# Patient Record
Sex: Female | Born: 1970 | Race: White | Hispanic: No | Marital: Married | Smoking: Former smoker
Health system: Southern US, Community
[De-identification: ages and names within clinical notes are randomized; demographics above are authoritative.]

## PROBLEM LIST (undated history)

## (undated) HISTORY — PX: NO PAST SURGERIES: SHX2092

---

## 2016-07-31 ENCOUNTER — Other Ambulatory Visit: Payer: Self-pay | Admitting: Obstetrics and Gynecology

## 2016-07-31 DIAGNOSIS — N6489 Other specified disorders of breast: Secondary | ICD-10-CM

## 2016-08-04 ENCOUNTER — Ambulatory Visit
Admission: RE | Admit: 2016-08-04 | Discharge: 2016-08-04 | Disposition: A | Discharge status: Home / Self Care | Payer: BC Managed Care – PPO | Source: Ambulatory Visit | Attending: Obstetrics and Gynecology | Admitting: Obstetrics and Gynecology

## 2016-08-04 DIAGNOSIS — N6489 Other specified disorders of breast: Secondary | ICD-10-CM

## 2019-06-23 DIAGNOSIS — D72819 Decreased white blood cell count, unspecified: Secondary | ICD-10-CM

## 2020-07-05 DIAGNOSIS — F419 Anxiety disorder, unspecified: Secondary | ICD-10-CM

## 2020-07-05 HISTORY — DX: Anxiety disorder, unspecified: F41.9

## 2020-07-26 ENCOUNTER — Encounter: Payer: Self-pay | Admitting: Cardiology

## 2020-07-26 DIAGNOSIS — K589 Irritable bowel syndrome without diarrhea: Secondary | ICD-10-CM

## 2020-07-26 DIAGNOSIS — R5383 Other fatigue: Secondary | ICD-10-CM

## 2020-07-26 DIAGNOSIS — K649 Unspecified hemorrhoids: Secondary | ICD-10-CM

## 2020-07-26 DIAGNOSIS — H9209 Otalgia, unspecified ear: Secondary | ICD-10-CM

## 2020-07-26 DIAGNOSIS — B029 Zoster without complications: Secondary | ICD-10-CM

## 2020-07-26 DIAGNOSIS — R209 Unspecified disturbances of skin sensation: Secondary | ICD-10-CM

## 2020-07-26 DIAGNOSIS — R5381 Other malaise: Secondary | ICD-10-CM | POA: Insufficient documentation

## 2020-07-26 DIAGNOSIS — J309 Allergic rhinitis, unspecified: Secondary | ICD-10-CM | POA: Insufficient documentation

## 2020-07-26 HISTORY — DX: Zoster without complications: B02.9

## 2020-07-26 HISTORY — DX: Otalgia, unspecified ear: H92.09

## 2020-07-26 HISTORY — DX: Other malaise: R53.81

## 2020-07-26 HISTORY — DX: Unspecified hemorrhoids: K64.9

## 2020-07-26 HISTORY — DX: Irritable bowel syndrome, unspecified: K58.9

## 2020-07-26 HISTORY — DX: Unspecified disturbances of skin sensation: R20.9

## 2020-07-26 HISTORY — DX: Other fatigue: R53.83

## 2020-07-26 HISTORY — DX: Allergic rhinitis, unspecified: J30.9

## 2020-07-27 ENCOUNTER — Encounter: Payer: Self-pay | Admitting: Cardiology

## 2020-07-27 ENCOUNTER — Other Ambulatory Visit: Payer: Self-pay

## 2020-07-27 ENCOUNTER — Ambulatory Visit: Payer: BC Managed Care – PPO | Admitting: Cardiology

## 2020-07-27 VITALS — BP 100/78 | HR 74 | Ht 65.5 in | Wt 157.0 lb

## 2020-07-27 DIAGNOSIS — I517 Cardiomegaly: Secondary | ICD-10-CM | POA: Diagnosis not present

## 2020-07-27 DIAGNOSIS — R002 Palpitations: Secondary | ICD-10-CM

## 2020-07-27 DIAGNOSIS — R072 Precordial pain: Secondary | ICD-10-CM

## 2020-07-27 MED ORDER — METOPROLOL TARTRATE 100 MG PO TABS: 100.0000 mg | ORAL_TABLET | ORAL | 0 refills | Status: AC

## 2020-07-27 MED ORDER — NITROGLYCERIN 0.4 MG SL SUBL
0.4000 mg | SUBLINGUAL_TABLET | SUBLINGUAL | 3 refills | Status: AC | PRN
Start: 2020-07-27 — End: ?

## 2020-07-27 NOTE — Patient Instructions (Addendum)
Medication Instructions:  Start Nitroglycerin 0.4 mg every 5 minutes as needed for chest pain   *If you need a refill on your cardiac medications before your next appointment, please call your pharmacy*   Lab Work: Your physician recommends that you return for lab work 1 week before your cardiac cta   If you have labs (blood work) drawn today and your tests are completely normal, you will receive your results only by: Marland Kitchen MyChart Message (if you have MyChart) OR . A paper copy in the mail If you have any lab test that is abnormal or we need to change your treatment, we will call you to review the results.   Testing/Procedures: Your physician has requested that you have an echocardiogram. Echocardiography is a painless test that uses sound waves to create images of your heart. It provides your doctor with information about the size and shape of your heart and how well your heart's chambers and valves are working. This procedure takes approximately one hour. There are no restrictions for this procedure. Your physician has requested that you have a Cardiac CTA *Instructions Below*   Follow-Up: At [facility], you and your health needs are our priority.  As part of our continuing mission to provide you with exceptional heart care, we have created designated Provider Care Teams.  These Care Teams include your primary Cardiologist (physician) and Advanced Practice Providers (APPs -  Physician Assistants and Nurse Practitioners) who all work together to provide you with the care you need, when you need it.  We recommend signing up for the patient portal called "MyChart".  Sign up information is provided on this After Visit Summary.  MyChart is used to connect with patients for Virtual Visits (Telemedicine).  Patients are able to view lab/test results, encounter notes, upcoming appointments, etc.  Non-urgent messages can be sent to your provider as well.   To learn more about what you can do with  MyChart, go to [email or web address].    Your next appointment:   3 month(s)  The format for your next appointment:   In Person  Provider:   Berniece Salines, DO   Other Instructions Your cardiac CT will be scheduled at one of the below locations:   [facility] [address] [place], [place] [address] [phone]   If scheduled at [facility], please arrive at the [facility] main entrance of [facility] 30 minutes prior to test start time. Proceed to the [facility] Radiology Department (first floor) to check-in and test prep.  Please follow these instructions carefully (unless otherwise directed):  On the Night Before the Test: . Be sure to Drink plenty of water. . Do not consume any caffeinated/decaffeinated beverages or chocolate 12 hours prior to your test. . Do not take any antihistamines 12 hours prior to your test.  On the Day of the Test: . Drink plenty of water. Do not drink any water within one hour of the test. . Do not eat any food 4 hours prior to the test. . You may take your regular medications prior to the test.  . Take metoprolol (Lopressor) two hours prior to test. . FEMALES- please wear underwire-free bra if available      After the Test: . Drink plenty of water. . After receiving IV contrast, you may experience a mild flushed feeling. This is normal. . On occasion, you may experience a mild rash up to 24 hours after the test. This is not dangerous. If  this occurs, you can take Benadryl 25 mg and increase your fluid intake. . If you experience trouble breathing, this can be serious. If it is severe call 911 IMMEDIATELY. If it is mild, please call our office.   Once we have confirmed authorization from your insurance company, we will call you to set up a date and time for your test. Based on how quickly your insurance processes prior authorizations requests, please allow up to 4 weeks to be contacted for scheduling your  Cardiac CT appointment. Be advised that routine Cardiac CT appointments could be scheduled as many as 8 weeks after your provider has ordered it.  For non-scheduling related questions, please contact the cardiac imaging nurse navigator should you have any questions/concerns: Marchia Bond, Cardiac Imaging Nurse Navigator Burley Saver, Interim Cardiac Imaging Nurse [place] [facility] Direct Office Dial: [phone]   For scheduling needs, including cancellations and rescheduling, please call Vivien Rota at [phone], option 3.

## 2020-07-27 NOTE — Progress Notes (Signed)
Cardiology Office Note:    Date:  07/27/2020   ID:  Kristen Burch, DOB 11/11/1971, MRN [id number]  PCP:  Lowella Dandy, NP  Cardiologist:  Berniece Salines, DO  Electrophysiologist:  None   Referring MD: Lowella Dandy, NP   Chief Complaint  Patient presents with  . Chest Pain    History of Present Illness:    Kristen Burch is a 49 y.o. female with a hx of anxiety, chronic allergic rhinitis and IBS presents today to be evaluated for chest pain.  She describes some intermittent chest pain which is left-sided nature.  She tells me that mostly it is a chest heaviness that starts in the left side radiates of her shoulders and sometimes upper jaw.  She tells me sometimes it goes up her shoulder but not ever down her arms.  The pain usually last for a few minutes prior to resolution.  Nothing make it better or worse.  She is concerned about this.  In addition she tells me there are some intermittent palpitations very seldom but it recurs.  She denies any shortness of breath.  She states the chest pain is her main concern and she is worried about this.  Of note she tells me in the past that she had had echocardiogram 60 years ago as well as wore a monitor for palpitations.  She says she was told that she had some skipped beats.  Past Medical History:  Diagnosis Date  . Anxiety 07/05/2020  . Chronic allergic rhinitis 07/26/2020  . Disturbance of skin sensation 07/26/2020  . Ear pain 07/26/2020  . Hemorrhoids without complication 06/29/7208  . Herpes zoster without complication 03/31/961  . IBS (irritable bowel syndrome) 07/26/2020  . Malaise and fatigue 07/26/2020    Past Surgical History:  Procedure Laterality Date  . NO PAST SURGERIES      Current Medications: Current Meds  Medication Sig  . ALPRAZolam (XANAX) 0.25 MG tablet Take 0.25 mg by mouth every 8 (eight) hours as needed.  . fluticasone (FLONASE) 50 MCG/ACT nasal spray Place 1 spray into the nose daily.  . ST JOHNS WORT EXTRACT PO Take by mouth.      Allergies:   Patient has no known allergies.   Social History   Socioeconomic History  . Marital status: Married    Spouse name: Not on file  . Number of children: Not on file  . Years of education: Not on file  . Highest education level: Not on file  Occupational History  . Not on file  Tobacco Use  . Smoking status: Former Smoker    Quit date: 07/26/2000    Years since quitting: 20.0  . Smokeless tobacco: Never Used  Substance and Sexual Activity  . Alcohol use: Never  . Drug use: Never  . Sexual activity: Not on file  Other Topics Concern  . Not on file  Social History Narrative  . Not on file   Social Determinants of Health   Financial Resource Strain:   . Difficulty of Paying Living Expenses:   Food Insecurity:   . Worried About Charity fundraiser in the Last Year:   . Arboriculturist in the Last Year:   Transportation Needs:   . Film/video editor (Medical):   Marland Kitchen Lack of Transportation (Non-Medical):   Physical Activity:   . Days of Exercise per Week:   . Minutes of Exercise per Session:   Stress:   . Feeling of Stress :  Social Connections:   . Frequency of Communication with Friends and Family:   . Frequency of Social Gatherings with Friends and Family:   . Attends Religious Services:   . Active Member of Clubs or Organizations:   . Attends Archivist Meetings:   Marland Kitchen Marital Status:      Family History: The patient's family history includes Arthritis in her mother; Diabetes in her father; Diverticulitis in her mother; Hyperlipidemia in her father.  ROS:   Review of Systems  Constitution: Negative for decreased appetite, fever and weight gain.  HENT: Negative for congestion, ear discharge, hoarse voice and sore throat.   Eyes: Negative for discharge, redness, vision loss in right eye and visual halos.  Cardiovascular: Reports chest pain.  Negative for dyspnea on exertion, leg swelling, orthopnea and palpitations.  Respiratory: Negative  for cough, hemoptysis, shortness of breath and snoring.   Endocrine: Negative for heat intolerance and polyphagia.  Hematologic/Lymphatic: Negative for bleeding problem. Does not bruise/bleed easily.  Skin: Negative for flushing, nail changes, rash and suspicious lesions.  Musculoskeletal: Negative for arthritis, joint pain, muscle cramps, myalgias, neck pain and stiffness.  Gastrointestinal: Negative for abdominal pain, bowel incontinence, diarrhea and excessive appetite.  Genitourinary: Negative for decreased libido, genital sores and incomplete emptying.  Neurological: Negative for brief paralysis, focal weakness, headaches and loss of balance.  Psychiatric/Behavioral: Negative for altered mental status, depression and suicidal ideas.  Allergic/Immunologic: Negative for HIV exposure and persistent infections.    EKGs/Labs/Other Studies Reviewed:    The following studies were reviewed today:   EKG:  The ekg ordered today demonstrates sinus rhythm, 81 bpm, prolonged PR interval and P wave morphology suggesting left atrial enlargement.  Recent Labs: No results found for requested labs within last 8760 hours.  Recent Lipid Panel No results found for: CHOL, TRIG, HDL, CHOLHDL, VLDL, LDLCALC, LDLDIRECT  Physical Exam:    VS:  BP 100/78 (BP Location: Right Arm, Patient Position: Sitting, Cuff Size: Normal)   Pulse 74   Ht 5' 5.5" (1.664 m)   Wt 157 lb (71.2 kg)   SpO2 98%   BMI 25.73 kg/m     Wt Readings from Last 3 Encounters:  07/27/20 157 lb (71.2 kg)  07/15/20 158 lb (71.7 kg)     GEN: Well nourished, well developed in no acute distress HEENT: Normal NECK: No JVD; No carotid bruits LYMPHATICS: No lymphadenopathy CARDIAC: S1S2 noted,RRR, no murmurs, rubs, gallops RESPIRATORY:  Clear to auscultation without rales, wheezing or rhonchi  ABDOMEN: Soft, non-tender, non-distended, +bowel sounds, no guarding. EXTREMITIES: No edema, No cyanosis, no clubbing MUSCULOSKELETAL:  No  deformity  SKIN: Warm and dry NEUROLOGIC:  Alert and oriented x 3, non-focal PSYCHIATRIC:  Normal affect, good insight  ASSESSMENT:    1. LAE (left atrial enlargement)   2. Precordial pain   3. Palpitations    PLAN:     Her chest pain is concerning although atypical.  I like to have the patient undergo her work-up .  A coronary/cardiac CTA is appropriate in this patient.  She does not have any IV contrast dye allergies she is agreeable to proceed with this testing.  I would like to get her lipid profile but she told me she had this done with her doctor and she will have this report sent to me.  In the meantime, sublingual nitroglycerin prescription was sent, its protocol and 911 protocol explained and the patient vocalized understanding questions were answered to the patient's satisfaction.  For palpitations as  well as her EKG suggesting left atrial enlargement an echocardiogram has been ordered to assess for any structural abnormalities.  The patient is in agreement with the above plan. The patient left the office in stable condition.  The patient will follow up in 3 months or sooner if needed.   Medication Adjustments/Labs and Tests Ordered: Current medicines are reviewed at length with the patient today.  Concerns regarding medicines are outlined above.  Orders Placed This Encounter  Procedures  . CT CORONARY MORPH W/CTA COR W/SCORE W/CA W/CM &/OR WO/CM  . CT CORONARY FRACTIONAL FLOW RESERVE DATA PREP  . CT CORONARY FRACTIONAL FLOW RESERVE FLUID ANALYSIS  . Basic metabolic panel  . EKG 12-Lead  . ECHOCARDIOGRAM COMPLETE   Meds ordered this encounter  Medications  . nitroGLYCERIN (NITROSTAT) 0.4 MG SL tablet    Sig: Place 1 tablet (0.4 mg total) under the tongue every 5 (five) minutes as needed for chest pain.    Dispense:  25 tablet    Refill:  3  . metoprolol tartrate (LOPRESSOR) 100 MG tablet    Sig: Take 1 tablet (100 mg total) by mouth as directed. Take 1 tablet by mouth  2 hours before CTA    Dispense:  1 tablet    Refill:  0    Patient Instructions  Medication Instructions:  Start Nitroglycerin 0.4 mg every 5 minutes as needed for chest pain   *If you need a refill on your cardiac medications before your next appointment, please call your pharmacy*   Lab Work: Your physician recommends that you return for lab work 1 week before your cardiac cta   If you have labs (blood work) drawn today and your tests are completely normal, you will receive your results only by: Marland Kitchen MyChart Message (if you have MyChart) OR . A paper copy in the mail If you have any lab test that is abnormal or we need to change your treatment, we will call you to review the results.   Testing/Procedures: Your physician has requested that you have an echocardiogram. Echocardiography is a painless test that uses sound waves to create images of your heart. It provides your doctor with information about the size and shape of your heart and how well your heart's chambers and valves are working. This procedure takes approximately one hour. There are no restrictions for this procedure. Your physician has requested that you have a Cardiac CTA *Instructions Below*   Follow-Up: At [facility], you and your health needs are our priority.  As part of our continuing mission to provide you with exceptional heart care, we have created designated Provider Care Teams.  These Care Teams include your primary Cardiologist (physician) and Advanced Practice Providers (APPs -  Physician Assistants and Nurse Practitioners) who all work together to provide you with the care you need, when you need it.  We recommend signing up for the patient portal called "MyChart".  Sign up information is provided on this After Visit Summary.  MyChart is used to connect with patients for Virtual Visits (Telemedicine).  Patients are able to view lab/test results, encounter notes, upcoming appointments, etc.  Non-urgent  messages can be sent to your provider as well.   To learn more about what you can do with MyChart, go to [email or web address].    Your next appointment:   3 month(s)  The format for your next appointment:   In Person  Provider:   Berniece Salines, DO   Other Instructions Your cardiac CT will be  scheduled at one of the below locations:   [facility] [address] [place], [place] [address] [phone]   If scheduled at [facility], please arrive at the [facility] main entrance of [facility] 30 minutes prior to test start time. Proceed to the [facility] Radiology Department (first floor) to check-in and test prep.  Please follow these instructions carefully (unless otherwise directed):  On the Night Before the Test: . Be sure to Drink plenty of water. . Do not consume any caffeinated/decaffeinated beverages or chocolate 12 hours prior to your test. . Do not take any antihistamines 12 hours prior to your test.  On the Day of the Test: . Drink plenty of water. Do not drink any water within one hour of the test. . Do not eat any food 4 hours prior to the test. . You may take your regular medications prior to the test.  . Take metoprolol (Lopressor) two hours prior to test. . FEMALES- please wear underwire-free bra if available      After the Test: . Drink plenty of water. . After receiving IV contrast, you may experience a mild flushed feeling. This is normal. . On occasion, you may experience a mild rash up to 24 hours after the test. This is not dangerous. If this occurs, you can take Benadryl 25 mg and increase your fluid intake. . If you experience trouble breathing, this can be serious. If it is severe call 911 IMMEDIATELY. If it is mild, please call our office.   Once we have confirmed authorization from your insurance company, we will call you to set up a date and time for your test. Based on how quickly your insurance processes prior  authorizations requests, please allow up to 4 weeks to be contacted for scheduling your Cardiac CT appointment. Be advised that routine Cardiac CT appointments could be scheduled as many as 8 weeks after your provider has ordered it.  For non-scheduling related questions, please contact the cardiac imaging nurse navigator should you have any questions/concerns: Marchia Bond, Cardiac Imaging Nurse Navigator Burley Saver, Interim Cardiac Imaging Nurse [place] [facility] Services Direct Office Dial: [phone]   For scheduling needs, including cancellations and rescheduling, please call Vivien Rota at [phone], option 3.        Adopting a Healthy Lifestyle.  Know what a healthy weight is for you (roughly BMI <25) and aim to maintain this   Aim for 7+ servings of fruits and vegetables daily   65-80+ fluid ounces of water or unsweet tea for healthy kidneys   Limit to max 1 drink of alcohol per day; avoid smoking/tobacco   Limit animal fats in diet for cholesterol and heart health - choose grass fed whenever available   Avoid highly processed foods, and foods high in saturated/trans fats   Aim for low stress - take time to unwind and care for your mental health   Aim for 150 min of moderate intensity exercise weekly for heart health, and weights twice weekly for bone health   Aim for 7-9 hours of sleep daily   When it comes to diets, agreement about the perfect plan isnt easy to find, even among the experts. Experts at the [employer] developed an idea known as the Healthy Eating Plate. Just imagine a plate divided into logical, healthy portions.   The emphasis is on diet quality:   Load up on vegetables and fruits - one-half of your  plate: Aim for color and variety, and remember that potatoes dont count.   Go for whole grains - one-quarter of your plate: Whole wheat, barley, wheat berries, quinoa, oats, brown rice, and foods made with them.  If you want pasta, go with whole wheat pasta.   Protein power - one-quarter of your plate: Fish, chicken, beans, and nuts are all healthy, versatile protein sources. Limit red meat.   The diet, however, does go beyond the plate, offering a few other suggestions.   Use healthy plant oils, such as olive, canola, soy, corn, sunflower and peanut. Check the labels, and avoid partially hydrogenated oil, which have unhealthy trans fats.   If youre thirsty, drink water. Coffee and tea are good in moderation, but skip sugary drinks and limit milk and dairy products to one or two daily servings.   The type of carbohydrate in the diet is more important than the amount. Some sources of carbohydrates, such as vegetables, fruits, whole grains, and beans-are healthier than others.   Finally, stay active  Signed, Berniece Salines, DO  07/27/2020 10:08 AM    [place]

## 2020-08-16 ENCOUNTER — Other Ambulatory Visit: Payer: Self-pay

## 2020-08-16 ENCOUNTER — Ambulatory Visit (INDEPENDENT_AMBULATORY_CARE_PROVIDER_SITE_OTHER): Payer: BC Managed Care – PPO

## 2020-08-16 DIAGNOSIS — R002 Palpitations: Secondary | ICD-10-CM | POA: Diagnosis not present

## 2020-08-16 DIAGNOSIS — I517 Cardiomegaly: Secondary | ICD-10-CM

## 2020-08-16 LAB — ECHOCARDIOGRAM COMPLETE
Area-P 1/2: 3.27 cm2
S' Lateral: 2.8 cm

## 2020-08-16 NOTE — Progress Notes (Signed)
Complete echocardiogram performed.  Jimmy Cynethia Schindler RDCS, RVT  

## 2020-08-18 ENCOUNTER — Telehealth: Payer: Self-pay | Admitting: Cardiology

## 2020-08-18 ENCOUNTER — Telehealth: Payer: Self-pay

## 2020-08-18 NOTE — Telephone Encounter (Signed)
-----   Message from Thomasene Ripple, DO sent at 08/17/2020  4:13 PM EDT ----- The echo showed that the heart is not fully relaxing like it should ( diastolic dysfunction) ,but otherwise normal. I will discuss it at the next office visit.

## 2020-08-18 NOTE — Telephone Encounter (Signed)
Spoke with patient regarding results and recommendation.  Patient verbalizes understanding and is agreeable to plan of care. Advised patient to call back with any issues or concerns.  

## 2020-08-18 NOTE — Telephone Encounter (Signed)
Patient returning phone call 

## 2020-08-18 NOTE — Telephone Encounter (Signed)
Left message on patients voicemail to please return our call.   

## 2020-08-27 ENCOUNTER — Telehealth (HOSPITAL_COMMUNITY): Payer: Self-pay | Admitting: Emergency Medicine

## 2020-08-27 NOTE — Telephone Encounter (Signed)
Pt returning phone call regarding upcoming cardiac imaging study; pt verbalizes understanding of appt date/time, parking situation and where to check in, pre-test NPO status and medications ordered, and verified current allergies; name and call back number provided for further questions should they arise Armari Fussell RN Navigator Cardiac Imaging [place] [facility] [phone] office [phone] cell   

## 2020-08-27 NOTE — Telephone Encounter (Signed)
Attempted to call patient regarding upcoming cardiac CT appointment. °Left message on voicemail with name and callback number °Jhett Fretwell RN Navigator Cardiac Imaging °[place] [facility] °[phone] Office °[phone] Cell ° °

## 2020-08-31 ENCOUNTER — Encounter (HOSPITAL_COMMUNITY): Payer: Self-pay

## 2020-08-31 ENCOUNTER — Other Ambulatory Visit: Payer: Self-pay

## 2020-08-31 ENCOUNTER — Ambulatory Visit (HOSPITAL_COMMUNITY)
Admission: RE | Admit: 2020-08-31 | Discharge: 2020-08-31 | Disposition: A | Discharge status: Home / Self Care | Payer: BC Managed Care – PPO | Source: Ambulatory Visit | Attending: Cardiology | Admitting: Cardiology

## 2020-08-31 DIAGNOSIS — R072 Precordial pain: Secondary | ICD-10-CM | POA: Diagnosis present

## 2020-08-31 MED ORDER — NITROGLYCERIN 0.4 MG SL SUBL
SUBLINGUAL_TABLET | SUBLINGUAL | Status: AC
  Filled 2020-08-31: qty 2

## 2020-08-31 MED ORDER — IOHEXOL 350 MG/ML SOLN
80.0000 mL | Freq: Once | INTRAVENOUS | Status: AC | PRN
  Administered 2020-08-31: 80 mL via INTRAVENOUS

## 2020-08-31 MED ORDER — NITROGLYCERIN 0.4 MG SL SUBL
0.8000 mg | SUBLINGUAL_TABLET | Freq: Once | SUBLINGUAL | Status: AC
  Administered 2020-08-31: 0.8 mg via SUBLINGUAL

## 2020-09-01 ENCOUNTER — Telehealth: Payer: Self-pay | Admitting: Cardiology

## 2020-09-01 NOTE — Telephone Encounter (Signed)
Left message for patient to return call.

## 2020-09-01 NOTE — Telephone Encounter (Signed)
    Pt is returning call from [facility] to get CT result

## 2020-09-01 NOTE — Telephone Encounter (Signed)
Patient is returning call.  °

## 2020-09-01 NOTE — Telephone Encounter (Signed)
Patient informed of results.  

## 2020-10-28 ENCOUNTER — Encounter: Payer: Self-pay | Admitting: Cardiology

## 2020-10-28 ENCOUNTER — Ambulatory Visit: Payer: BC Managed Care – PPO | Admitting: Cardiology

## 2020-10-28 ENCOUNTER — Other Ambulatory Visit: Payer: Self-pay

## 2020-10-28 VITALS — BP 112/72 | HR 76 | Ht 65.0 in | Wt 155.4 lb

## 2020-10-28 DIAGNOSIS — R0789 Other chest pain: Secondary | ICD-10-CM | POA: Diagnosis not present

## 2020-10-28 NOTE — Progress Notes (Signed)
Cardiology Office Note:    Date:  10/28/2020   ID:  Kristen Burch, DOB 1971/03/24, MRN [id number]  PCP:  Hurshel Party, NP  Cardiologist:  Thomasene Ripple, DO  Electrophysiologist:  None   Referring MD: Hurshel Party, NP   " I am doing well"  History of Present Illness:    Kristen Burch is a 49 y.o. female with a hx of  anxiety, chronic allergic rhinitis and IBS presents today for follow up visit.  Since I last saw the patient she was able to get her echocardiogram as well as her coronary CTA.  We called the patient with these results which were essentially normal.  She is here today for follow-up visit she tells me she is doing well from a cardiovascular standpoint.  She was recently started on Lexapro and she seems that this is helping her greatly.  She has no complaints at this time.   Past Medical History:  Diagnosis Date  . Anxiety 07/05/2020  . Chronic allergic rhinitis 07/26/2020  . Disturbance of skin sensation 07/26/2020  . Ear pain 07/26/2020  . Hemorrhoids without complication 07/26/2020  . Herpes zoster without complication 07/26/2020  . IBS (irritable bowel syndrome) 07/26/2020  . Malaise and fatigue 07/26/2020    Past Surgical History:  Procedure Laterality Date  . NO PAST SURGERIES      Current Medications: Current Meds  Medication Sig  . ALPRAZolam (XANAX) 0.25 MG tablet Take 0.25 mg by mouth every 8 (eight) hours as needed.  Marland Kitchen escitalopram (LEXAPRO) 5 MG tablet Take 5 mg by mouth daily.  . fluticasone (FLONASE) 50 MCG/ACT nasal spray Place 1 spray into the nose daily.  . metoprolol tartrate (LOPRESSOR) 100 MG tablet Take 1 tablet (100 mg total) by mouth as directed. Take 1 tablet by mouth 2 hours before CTA  . nitroGLYCERIN (NITROSTAT) 0.4 MG SL tablet Place 1 tablet (0.4 mg total) under the tongue every 5 (five) minutes as needed for chest pain.  . ST JOHNS WORT EXTRACT PO Take by mouth.     Allergies:   Patient has no known allergies.   Social History   Socioeconomic  History  . Marital status: Married    Spouse name: Not on file  . Number of children: Not on file  . Years of education: Not on file  . Highest education level: Not on file  Occupational History  . Not on file  Tobacco Use  . Smoking status: Former Smoker    Quit date: 07/26/2000    Years since quitting: 20.2  . Smokeless tobacco: Never Used  Substance and Sexual Activity  . Alcohol use: Never  . Drug use: Never  . Sexual activity: Not on file  Other Topics Concern  . Not on file  Social History Narrative  . Not on file   Social Determinants of Health   Financial Resource Strain:   . Difficulty of Paying Living Expenses: Not on file  Food Insecurity:   . Worried About Programme researcher, broadcasting/film/video in the Last Year: Not on file  . Ran Out of Food in the Last Year: Not on file  Transportation Needs:   . Lack of Transportation (Medical): Not on file  . Lack of Transportation (Non-Medical): Not on file  Physical Activity:   . Days of Exercise per Week: Not on file  . Minutes of Exercise per Session: Not on file  Stress:   . Feeling of Stress : Not on file  Social Connections:   .  Frequency of Communication with Friends and Family: Not on file  . Frequency of Social Gatherings with Friends and Family: Not on file  . Attends Religious Services: Not on file  . Active Member of Clubs or Organizations: Not on file  . Attends Banker Meetings: Not on file  . Marital Status: Not on file     Family History: The patient's family history includes Arthritis in her mother; Diabetes in her father; Diverticulitis in her mother; Hyperlipidemia in her father.  ROS:   Review of Systems  Constitution: Negative for decreased appetite, fever and weight gain.  HENT: Negative for congestion, ear discharge, hoarse voice and sore throat.   Eyes: Negative for discharge, redness, vision loss in right eye and visual halos.  Cardiovascular: Negative for chest pain, dyspnea on exertion, leg  swelling, orthopnea and palpitations.  Respiratory: Negative for cough, hemoptysis, shortness of breath and snoring.   Endocrine: Negative for heat intolerance and polyphagia.  Hematologic/Lymphatic: Negative for bleeding problem. Does not bruise/bleed easily.  Skin: Negative for flushing, nail changes, rash and suspicious lesions.  Musculoskeletal: Negative for arthritis, joint pain, muscle cramps, myalgias, neck pain and stiffness.  Gastrointestinal: Negative for abdominal pain, bowel incontinence, diarrhea and excessive appetite.  Genitourinary: Negative for decreased libido, genital sores and incomplete emptying.  Neurological: Negative for brief paralysis, focal weakness, headaches and loss of balance.  Psychiatric/Behavioral: Negative for altered mental status, depression and suicidal ideas.  Allergic/Immunologic: Negative for HIV exposure and persistent infections.    EKGs/Labs/Other Studies Reviewed:    The following studies were reviewed today:   EKG:  None today  CCTA IMPRESSION: 1. Coronary calcium score of 0. This was 0 percentile for age and sex matched control.  2. Normal coronary origin with right dominance.  3. No evidence of CAD.  Mataeo Ingwersen, DO   TTE Impression  1. Left ventricular ejection fraction, by estimation, is 60 to 65%. The left ventricle has normal function. The left ventricle has no regional wall motion abnormalities. There is mild concentric left ventricular  hypertrophy. Left ventricular diastolic parameters are consistent with Grade I diastolic dysfunction (impaired  relaxation).  2. Right ventricular systolic function is normal. The right ventricular size is normal. There is normal pulmonary artery systolic pressure.  3. The mitral valve is normal in structure. No evidence of mitral valve regurgitation. No evidence of mitral stenosis.  4. The aortic valve is tricuspid. Aortic valve regurgitation is trivial. No aortic stenosis is present.   5. The inferior vena cava is normal in size with greater than 50% respiratory variability, suggesting right atrial pressure of 3 mmHg.   FINDINGS  Left Ventricle: Left ventricular ejection fraction, by estimation, is 60 to 65%. The left ventricle has normal function. The left ventricle has no regional wall motion abnormalities. The left ventricular internal cavity  size was normal in size. There is  mild concentric left ventricular hypertrophy. Left ventricular diastolic parameters are consistent with Grade I diastolic dysfunction (impaired relaxation). Normal left ventricularfilling pressure.  Right Ventricle: The right ventricular size is normal. No increase in  right ventricular wall thickness. Right ventricular systolic function is  normal. There is normal pulmonary artery systolic pressure. The tricuspid regurgitant velocity is 2.36 m/s, and with an assumed right atrial pressure of 3 mmHg, the estimated right ventricular systolic pressure is 25.3 mmHg.  Left Atrium: Left atrial size was normal in size.  Right Atrium: Right atrial size was normal in size.  Pericardium: There is no evidence of  pericardial effusion.  Mitral Valve: The mitral valve is normal in structure. Normal mobility of the mitral valve leaflets. No evidence of mitral valve regurgitation. No evidence of mitral valve stenosis.  Tricuspid Valve: The tricuspid valve is normal in structure. Tricuspid valve regurgitation is trivial. No evidence of tricuspid stenosis.  Aortic Valve: The aortic valve is tricuspid. Aortic valve regurgitation is  trivial. No aortic stenosis is present.  Pulmonic Valve: The pulmonic valve was normal in structure. Pulmonic valve regurgitation is not visualized. No evidence of pulmonic stenosis.  Aorta: The aortic root, ascending aorta and aortic arch are all structurally normal, with no evidence of dilitation or obstruction.  Venous: A normal flow pattern is recorded from the right upper pulmonary  vein. The inferior vena cava is normal in size with greater than 50% respiratory variability, suggesting right atrial pressure of 3 mmHg.  IAS/Shunts: No atrial level shunt detected by color flow Doppler.      Recent Labs: No results found for requested labs within last 8760 hours.  Recent Lipid Panel No results found for: CHOL, TRIG, HDL, CHOLHDL, VLDL, LDLCALC, LDLDIRECT  Physical Exam:    VS:  BP 112/72   Pulse 76   Ht 5\' 5"  (1.651 m)   Wt 155 lb 6.4 oz (70.5 kg)   SpO2 98%   BMI 25.86 kg/m     Wt Readings from Last 3 Encounters:  10/28/20 155 lb 6.4 oz (70.5 kg)  07/27/20 157 lb (71.2 kg)  07/15/20 158 lb (71.7 kg)     GEN: Well nourished, well developed in no acute distress HEENT: Normal NECK: No JVD; No carotid bruits LYMPHATICS: No lymphadenopathy CARDIAC: S1S2 noted,RRR, no murmurs, rubs, gallops RESPIRATORY:  Clear to auscultation without rales, wheezing or rhonchi  ABDOMEN: Soft, non-tender, non-distended, +bowel sounds, no guarding. EXTREMITIES: No edema, No cyanosis, no clubbing MUSCULOSKELETAL:  No deformity  SKIN: Warm and dry NEUROLOGIC:  Alert and oriented x 3, non-focal PSYCHIATRIC:  Normal affect, good insight  ASSESSMENT:    1. Atypical chest pain    PLAN:    She appears to be doing well from a cardiovascular standpoint.  No changes will be done will help medication regimen.  The patient is in agreement with the above plan. The patient left the office in stable condition.  The patient will follow up as needed.    Medication Adjustments/Labs and Tests Ordered: Current medicines are reviewed at length with the patient today.  Concerns regarding medicines are outlined above.  No orders of the defined types were placed in this encounter.  No orders of the defined types were placed in this encounter.   Patient Instructions  Medication Instructions:  Your physician recommends that you continue on your current medications as directed. Please  refer to the Current Medication list given to you today.  *If you need a refill on your cardiac medications before your next appointment, please call your pharmacy*   Lab Work: None If you have labs (blood work) drawn today and your tests are completely normal, you will receive your results only by: Marland Kitchen. MyChart Message (if you have MyChart) OR . A paper copy in the mail If you have any lab test that is abnormal or we need to change your treatment, we will call you to review the results.   Testing/Procedures: None.   Follow-Up: At [facility], you and your health needs are our priority.  As part of our continuing mission to provide you with exceptional heart care, we have  created designated Provider Care Teams.  These Care Teams include your primary Cardiologist (physician) and Advanced Practice Providers (APPs -  Physician Assistants and Nurse Practitioners) who all work together to provide you with the care you need, when you need it.  We recommend signing up for the patient portal called "MyChart".  Sign up information is provided on this After Visit Summary.  MyChart is used to connect with patients for Virtual Visits (Telemedicine).  Patients are able to view lab/test results, encounter notes, upcoming appointments, etc.  Non-urgent messages can be sent to your provider as well.   To learn more about what you can do with MyChart, go to [email or web address].     Other Instructions  Follow-up as needed.    Adopting a Healthy Lifestyle.  Know what a healthy weight is for you (roughly BMI <25) and aim to maintain this   Aim for 7+ servings of fruits and vegetables daily   65-80+ fluid ounces of water or unsweet tea for healthy kidneys   Limit to max 1 drink of alcohol per day; avoid smoking/tobacco   Limit animal fats in diet for cholesterol and heart health - choose grass fed whenever available   Avoid highly processed foods, and foods high in saturated/trans fats    Aim for low stress - take time to unwind and care for your mental health   Aim for 150 min of moderate intensity exercise weekly for heart health, and weights twice weekly for bone health   Aim for 7-9 hours of sleep daily   When it comes to diets, agreement about the perfect plan isnt easy to find, even among the experts. Experts at the [facility] [employer] developed an idea known as the Healthy Eating Plate. Just imagine a plate divided into logical, healthy portions.   The emphasis is on diet quality:   Load up on vegetables and fruits - one-half of your plate: Aim for color and variety, and remember that potatoes dont count.   Go for whole grains - one-quarter of your plate: Whole wheat, barley, wheat berries, quinoa, oats, brown rice, and foods made with them. If you want pasta, go with whole wheat pasta.   Protein power - one-quarter of your plate: Fish, chicken, beans, and nuts are all healthy, versatile protein sources. Limit red meat.   The diet, however, does go beyond the plate, offering a few other suggestions.   Use healthy plant oils, such as olive, canola, soy, corn, sunflower and peanut. Check the labels, and avoid partially hydrogenated oil, which have unhealthy trans fats.   If youre thirsty, drink water. Coffee and tea are good in moderation, but skip sugary drinks and limit milk and dairy products to one or two daily servings.   The type of carbohydrate in the diet is more important than the amount. Some sources of carbohydrates, such as vegetables, fruits, whole grains, and beans-are healthier than others.   Finally, stay active  Signed, Thomasene Ripple, DO  10/28/2020 11:55 AM    [facility]

## 2020-10-28 NOTE — Patient Instructions (Signed)
Medication Instructions:  Your physician recommends that you continue on your current medications as directed. Please refer to the Current Medication list given to you today.  *If you need a refill on your cardiac medications before your next appointment, please call your pharmacy*   Lab Work: None If you have labs (blood work) drawn today and your tests are completely normal, you will receive your results only by: Marland Kitchen MyChart Message (if you have MyChart) OR . A paper copy in the mail If you have any lab test that is abnormal or we need to change your treatment, we will call you to review the results.   Testing/Procedures: None.   Follow-Up: At [facility], you and your health needs are our priority.  As part of our continuing mission to provide you with exceptional heart care, we have created designated Provider Care Teams.  These Care Teams include your primary Cardiologist (physician) and Advanced Practice Providers (APPs -  Physician Assistants and Nurse Practitioners) who all work together to provide you with the care you need, when you need it.  We recommend signing up for the patient portal called "MyChart".  Sign up information is provided on this After Visit Summary.  MyChart is used to connect with patients for Virtual Visits (Telemedicine).  Patients are able to view lab/test results, encounter notes, upcoming appointments, etc.  Non-urgent messages can be sent to your provider as well.   To learn more about what you can do with MyChart, go to [email or web address].     Other Instructions  Follow-up as needed.

## 2021-08-09 IMAGING — CT CT HEART MORP W/ CTA COR W/ SCORE W/ CA W/CM &/OR W/O CM
4 of 7 series · 8 of 20 positions shown, 9 images · IV contrast (APPLIED)
Comparison: None.
COMPARISON: None.

Addendum:
EXAM:
OVER-READ INTERPRETATION  CT CHEST

The following report is an over-read performed by radiologist Dr.
Asaanah Kusi [REDACTED] on 08/31/2020. This
over-read does not include interpretation of cardiac or coronary
anatomy or pathology. The coronary calcium score/coronary CTA
interpretation by the cardiologist is attached.
CLINICAL DATA: 49 year old female with chest pain.
Cardiac/Coronary  CT
TECHNIQUE: The patient was scanned on a Phillips Force scanner.

[Series 6: best diast 73 % · axial · 0.39mm/px · z∈[-192,-149]mm · 2 of 321 slices shown]
[im 107/321  vessel]
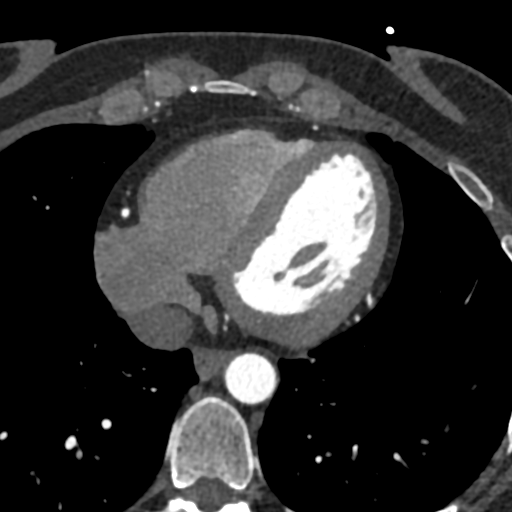
[im 214/321  vessel]
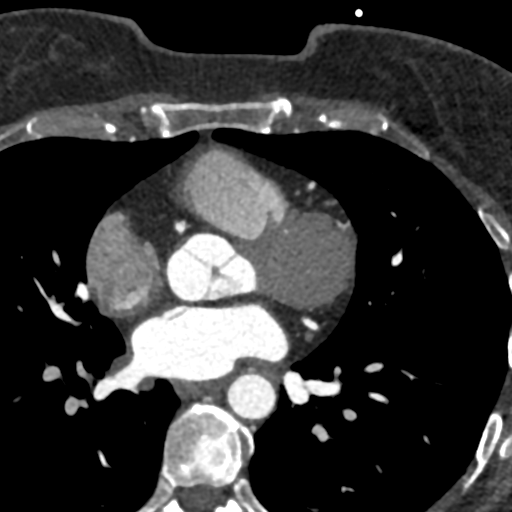

[Series 7: best syst · axial · 0.39mm/px · z∈[-192,-149]mm · 2 of 321 slices shown, 3 images]
[im 107/321  vessel]
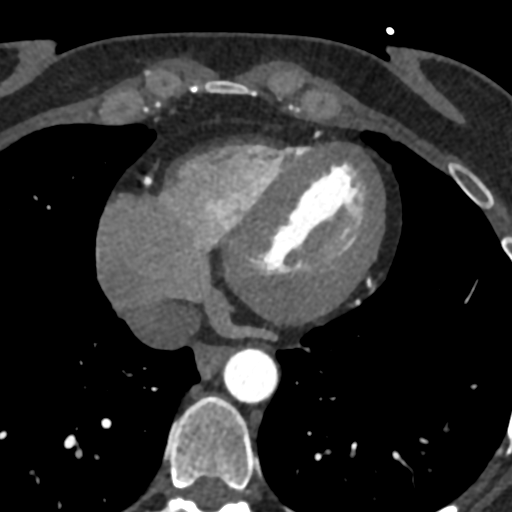
[im 107/321  lung]
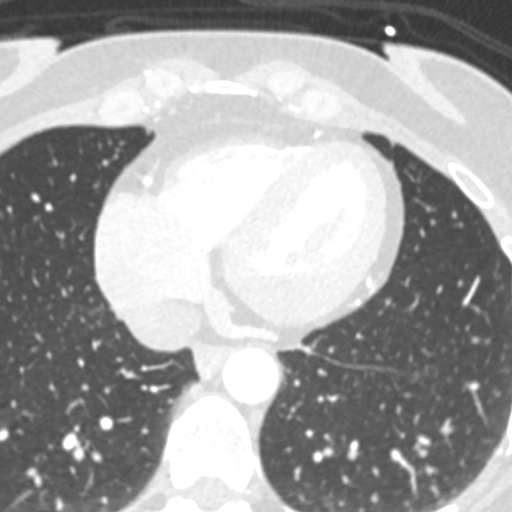
[im 214/321  vessel]
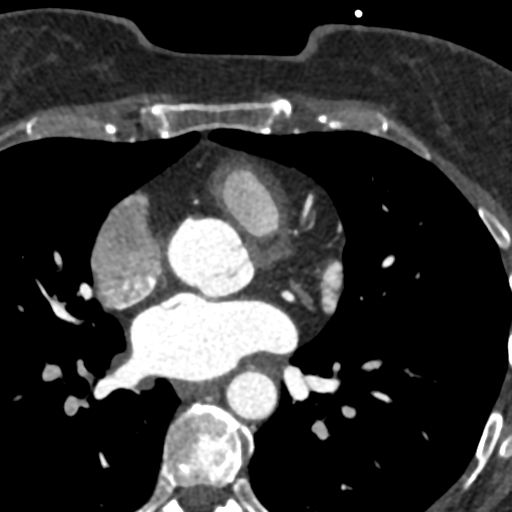

[Series 9: ts diast sharp 73 % · axial · 0.39mm/px · z∈[-192,-149]mm · 2 of 321 slices shown]
[im 107/321  lung]
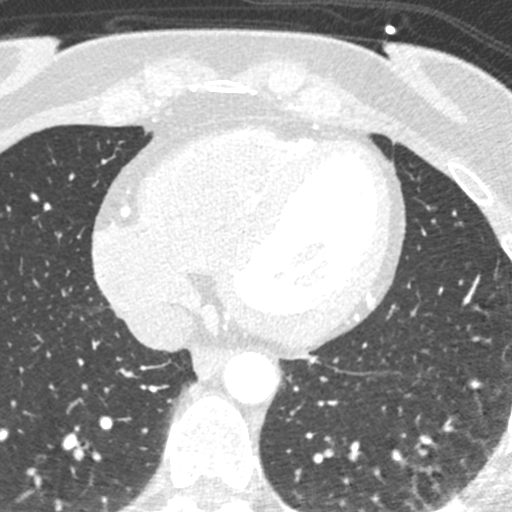
[im 214/321  lung]
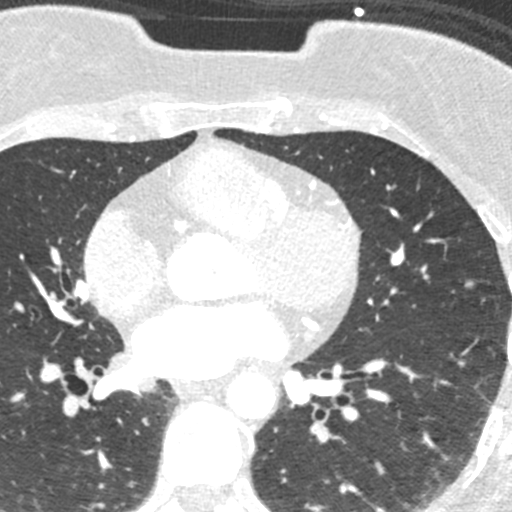

[Series 10: ts syst sharp · axial · 0.39mm/px · z∈[-192,-149]mm · 2 of 321 slices shown]
[im 107/321  lung]
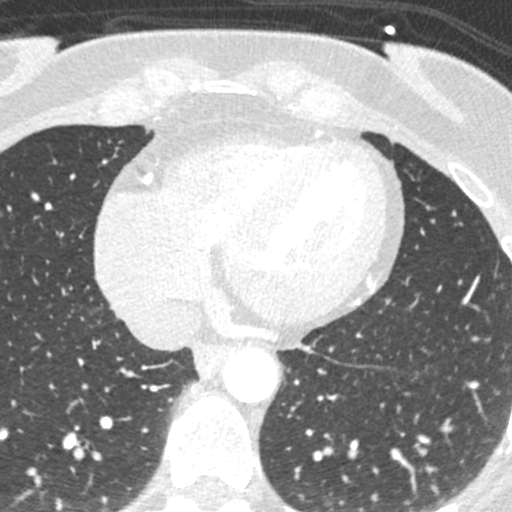
[im 214/321  lung]
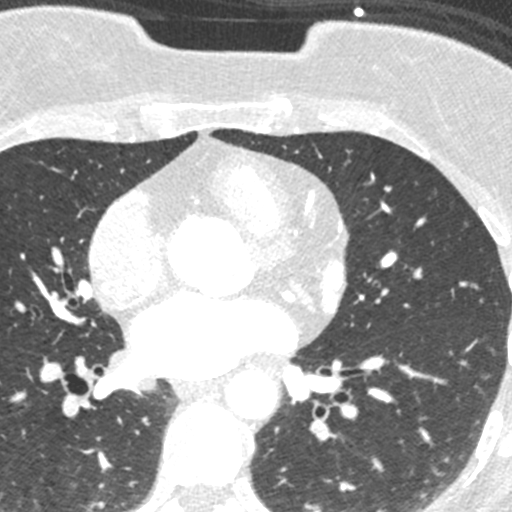

[8 of 20 positions shown; findings below may reference images not displayed]

FINDINGS: Within the visualized portions of the thorax there are no suspicious
appearing pulmonary nodules or masses, there is no acute
consolidative airspace disease, no pleural effusions, no
pneumothorax and no lymphadenopathy. Visualized portions of the
upper abdomen are unremarkable. There are no aggressive appearing
lytic or blastic lesions noted in the visualized portions of the
skeleton.
IMPRESSION: No significant incidental noncardiac findings are noted.
FINDINGS: A 120 kV prospective scan was triggered in the descending thoracic
aorta at 111 HU's. Axial non-contrast 3 mm slices were carried out
through the heart. The data set was analyzed on a dedicated work
station and scored using the Agatson method. Gantry rotation speed
was 250 msecs and collimation was .6 mm. No beta blockade and 0.8 mg
of sl NTG was given. The 3D data set was reconstructed in 5%
intervals of the 67-82 % of the R-R cycle. Diastolic phases were
analyzed on a dedicated work station using MPR, MIP and VRT modes.
The patient received 80 cc of contrast.

Aorta: Normal size.  No calcifications.  No dissection.

Aortic Valve:  Trileaflet.  No calcifications.

Coronary Arteries:  Normal coronary origin.  Right dominance.

RCA is a large dominant artery that gives rise to PDA and PLVB.
There is no plaque.

Left main is a large artery that gives rise to LAD and LCX arteries.

LAD is a large vessel that has no plaque.

LCX is a non-dominant artery that gives rise to one large OM1
branch. There is no plaque.

Other findings:

Normal pulmonary vein drainage into the left atrium.

Normal left atrial appendage without a thrombus.

Normal size of the pulmonary artery.
IMPRESSION: 1. Coronary calcium score of 0. This was 0 percentile for age and
sex matched control.

2. Normal coronary origin with right dominance.

3. No evidence of CAD.

Jafet Emmett, DO

*** End of Addendum ***
EXAM:
OVER-READ INTERPRETATION  CT CHEST

The following report is an over-read performed by radiologist Dr.
Asaanah Kusi [REDACTED] on 08/31/2020. This
over-read does not include interpretation of cardiac or coronary
anatomy or pathology. The coronary calcium score/coronary CTA
interpretation by the cardiologist is attached.
FINDINGS: Within the visualized portions of the thorax there are no suspicious
appearing pulmonary nodules or masses, there is no acute
consolidative airspace disease, no pleural effusions, no
pneumothorax and no lymphadenopathy. Visualized portions of the
upper abdomen are unremarkable. There are no aggressive appearing
lytic or blastic lesions noted in the visualized portions of the
skeleton.
IMPRESSION: No significant incidental noncardiac findings are noted.
